# Patient Record
Sex: Female | Born: 1995 | Hispanic: Yes | Marital: Married | Smoking: Never smoker
Health system: Southern US, Community
[De-identification: ages and names within clinical notes are randomized; demographics above are authoritative.]

## PROBLEM LIST (undated history)

## (undated) DIAGNOSIS — Z789 Other specified health status: Secondary | ICD-10-CM

## (undated) DIAGNOSIS — I959 Hypotension, unspecified: Secondary | ICD-10-CM

## (undated) DIAGNOSIS — K529 Noninfective gastroenteritis and colitis, unspecified: Secondary | ICD-10-CM

## (undated) HISTORY — PX: NO PAST SURGERIES: SHX2092

---
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
## 2016-01-10 ENCOUNTER — Ambulatory Visit (INDEPENDENT_AMBULATORY_CARE_PROVIDER_SITE_OTHER): Payer: Medicaid Other | Admitting: Women's Health

## 2016-01-10 ENCOUNTER — Encounter: Payer: Self-pay | Admitting: Women's Health

## 2016-01-10 VITALS — BP 97/48 | HR 93 | Wt 115.0 lb

## 2016-01-10 DIAGNOSIS — Z331 Pregnant state, incidental: Secondary | ICD-10-CM

## 2016-01-10 DIAGNOSIS — Z3482 Encounter for supervision of other normal pregnancy, second trimester: Secondary | ICD-10-CM | POA: Diagnosis not present

## 2016-01-10 DIAGNOSIS — Z1389 Encounter for screening for other disorder: Secondary | ICD-10-CM

## 2016-01-10 DIAGNOSIS — Z3A19 19 weeks gestation of pregnancy: Secondary | ICD-10-CM

## 2016-01-10 DIAGNOSIS — Z363 Encounter for antenatal screening for malformations: Secondary | ICD-10-CM

## 2016-01-10 DIAGNOSIS — Z3682 Encounter for antenatal screening for nuchal translucency: Secondary | ICD-10-CM

## 2016-01-10 LAB — POCT URINALYSIS DIPSTICK
Blood, UA: NEGATIVE
Glucose, UA: NEGATIVE
Ketones, UA: NEGATIVE
LEUKOCYTES UA: NEGATIVE
Nitrite, UA: NEGATIVE
PROTEIN UA: NEGATIVE

## 2016-01-10 MED ORDER — PRENATAL PLUS 27-1 MG PO TABS: 1.0000 | ORAL_TABLET | Freq: Every day | ORAL | 12 refills | Status: DC

## 2016-01-10 NOTE — Progress Notes (Signed)
Low-risk OB appointment G2P1001 7210w4d Estimated Date of Delivery: 06/08/16 BP (!) 97/48   Pulse 93   Wt 115 lb (52.2 kg)   LMP 09/02/2015 (Exact Date)   BP, weight, and urine reviewed.  Refer to obstetrical flow sheet for FH & FHR.  Reports good fm.  Denies regular uc's, lof, vb, or uti s/s. No complaints. Has applied for presumptive Mcaid.  Reviewed warning s/s to report, fm. Plan:  Continue routine obstetrical care  F/U in 2wks for OB appointment and anatomy u/s 2nd IT today

## 2016-01-10 NOTE — Patient Instructions (Signed)

## 2016-01-10 NOTE — Addendum Note (Signed)
Addended by: Shawna ClampBOOKER, Blanche Gallien R on: 01/10/2016 12:21 PM   Modules accepted: Orders

## 2016-01-13 NOTE — L&D Delivery Note (Signed)
21 y.o. G2P1001 at 1198w0d delivered a viable female infant in cephalic, LOA position. Loose nuchal cord, easily reduced. Anterior shoulder delivered with ease. 60 sec delayed cord clamping. Cord clamped x2 and cut. Placenta delivered spontaneously intact, with 3VC. Fundus firm on exam with massage and pitocin. Good hemostasis noted.  Anesthesia: No epidural Laceration: small periurethral, no sutures needed Suture: None Good hemostasis noted. EBL: 200 cc  Mom and baby recovering in LDR.    Weight: Pending skin to skin  Sponge and instrument count were correct x2. Placenta sent to L&D.  Howard PouchLauren Feng, MD PGY-1 Family Medicine 06/01/2016, 2:42 PM   OB FELLOW DELIVERY ATTESTATION  I was gloved and present for the delivery in its entirety, and I agree with the above resident's note.    Ernestina PennaNicholas Cystal Shannahan, MD 3:09 PM

## 2016-01-15 LAB — MATERNAL SCREEN, INTEGRATED #2
AFP MARKER: 74.1 ng/mL
AFP MoM: 1.36
CROWN RUMP LENGTH: 46.8 mm
DIA MOM: 0.81
DIA Value: 184.4 pg/mL
Estriol, Unconjugated: 2.22 ng/mL
GESTATIONAL AGE: 18.9 wk
Gest. Age on Collection Date: 11.4 weeks
HCG MOM: 1.4
Maternal Age at EDD: 19.8 years
NUCHAL TRANSLUCENCY MOM: 0.99
Nuchal Translucency (NT): 1.3 mm
Number of Fetuses: 1
PAPP-A MOM: 0.54
PAPP-A Value: 480.8 ng/mL
Test Results:: NEGATIVE
WEIGHT: 109 [lb_av]
Weight: 109 [lb_av]
hCG Value: 37.3 IU/mL
uE3 MoM: 1.38

## 2016-01-24 ENCOUNTER — Ambulatory Visit (INDEPENDENT_AMBULATORY_CARE_PROVIDER_SITE_OTHER): Payer: Self-pay

## 2016-01-24 ENCOUNTER — Ambulatory Visit (INDEPENDENT_AMBULATORY_CARE_PROVIDER_SITE_OTHER): Payer: Self-pay | Admitting: Obstetrics & Gynecology

## 2016-01-24 ENCOUNTER — Encounter: Payer: Self-pay | Admitting: Obstetrics & Gynecology

## 2016-01-24 VITALS — BP 90/50 | HR 93 | Wt 117.0 lb

## 2016-01-24 DIAGNOSIS — Z3482 Encounter for supervision of other normal pregnancy, second trimester: Secondary | ICD-10-CM

## 2016-01-24 DIAGNOSIS — Z331 Pregnant state, incidental: Secondary | ICD-10-CM

## 2016-01-24 DIAGNOSIS — Z3A21 21 weeks gestation of pregnancy: Secondary | ICD-10-CM

## 2016-01-24 DIAGNOSIS — Z1389 Encounter for screening for other disorder: Secondary | ICD-10-CM

## 2016-01-24 DIAGNOSIS — Z363 Encounter for antenatal screening for malformations: Secondary | ICD-10-CM

## 2016-01-24 DIAGNOSIS — O321XX1 Maternal care for breech presentation, fetus 1: Secondary | ICD-10-CM

## 2016-01-24 LAB — POCT URINALYSIS DIPSTICK
Blood, UA: NEGATIVE
GLUCOSE UA: NEGATIVE
Ketones, UA: NEGATIVE
Leukocytes, UA: NEGATIVE
NITRITE UA: NEGATIVE
Protein, UA: NEGATIVE

## 2016-01-24 NOTE — Progress Notes (Signed)
G2P1001 341w4d Estimated Date of Delivery: 06/08/16  Blood pressure (!) 90/50, pulse 93, weight 117 lb (53.1 kg), last menstrual period 09/02/2015, unknown if currently breastfeeding.   BP weight and urine results all reviewed and noted.  Please refer to the obstetrical flow sheet for the fundal height and fetal heart rate documentation:  Patient reports good fetal movement, denies any bleeding and no rupture of membranes symptoms or regular contractions. Patient is without complaints. All questions were answered.  Orders Placed This Encounter  Procedures  . POCT urinalysis dipstick    Plan:  Continued routine obstetrical care, sonogram is normal  Return in about 4 weeks (around 02/21/2016) for LROB.

## 2016-01-24 NOTE — Progress Notes (Signed)
US 20+4 wks,breech,cx 3.1 cm,anterior pl gr 0,normal left ovary,right ovary not visualized,fhr 141 bpm,svp of fluid 5 cm,efw 329 g,anatomy complete,no obvious abnormalities seen

## 2016-02-20 ENCOUNTER — Encounter: Payer: Self-pay | Admitting: Women's Health

## 2016-02-27 ENCOUNTER — Encounter: Payer: Self-pay | Admitting: Women's Health

## 2016-02-27 ENCOUNTER — Ambulatory Visit (INDEPENDENT_AMBULATORY_CARE_PROVIDER_SITE_OTHER): Payer: Self-pay | Admitting: Women's Health

## 2016-02-27 VITALS — BP 104/60 | HR 80 | Wt 127.0 lb

## 2016-02-27 DIAGNOSIS — N898 Other specified noninflammatory disorders of vagina: Secondary | ICD-10-CM

## 2016-02-27 DIAGNOSIS — Z3482 Encounter for supervision of other normal pregnancy, second trimester: Secondary | ICD-10-CM

## 2016-02-27 DIAGNOSIS — O26892 Other specified pregnancy related conditions, second trimester: Secondary | ICD-10-CM

## 2016-02-27 DIAGNOSIS — Z1389 Encounter for screening for other disorder: Secondary | ICD-10-CM

## 2016-02-27 DIAGNOSIS — Z3A25 25 weeks gestation of pregnancy: Secondary | ICD-10-CM

## 2016-02-27 DIAGNOSIS — Z331 Pregnant state, incidental: Secondary | ICD-10-CM

## 2016-02-27 LAB — POCT URINALYSIS DIPSTICK
Blood, UA: NEGATIVE
Glucose, UA: NEGATIVE
Ketones, UA: NEGATIVE
NITRITE UA: NEGATIVE
PROTEIN UA: NEGATIVE

## 2016-02-27 LAB — POCT WET PREP (WET MOUNT)
Clue Cells Wet Prep Whiff POC: NEGATIVE
Trichomonas Wet Prep HPF POC: ABSENT

## 2016-02-27 NOTE — Patient Instructions (Addendum)
Monistat 7 for yeast infection  You will have your sugar test next visit.  Please do not eat or drink anything after midnight the night before you come, not even water.  You will be here for at least two hours.     Call the office [phone]) or go to [facility] if:  You begin to have strong, frequent contractions  Your water breaks.  Sometimes it is a big gush of fluid, sometimes it is just a trickle that keeps getting your panties wet or running down your legs  You have vaginal bleeding.  It is normal to have a small amount of spotting if your cervix was checked.   You don't feel your baby moving like normal.  If you don't, get you something to eat and drink and lay down and focus on feeling your baby move.   If your baby is still not moving like normal, you should call the office or go to [facility].  [place]intonville[place]undo trimestre de [place]barazo (Second Trimester of Pregnancy) El segundo trimestre va desde la semana13 hasta la 28, desde el cuarto hasta el sexto mes, y suele ser el momento en el que mejor se siente. Su organismo se ha adaptado a Charity fundraiserestar embarazada y comienza a Diplomatic Services operational officersentirse fsicamente mejor. En general, las nuseas matutinas han disminuido o han desaparecido completamente, puede tener ms energa y un aumento de apetito. El segundo trimestre es tambin la poca en la que el feto se desarrolla rpidamente. Hacia el final del sexto mes, el feto mide aproximadamente 9pulgadas (23cm) y pesa alrededor de 1 libras (700g). Es probable que sienta que el beb se Teacher, English as a foreign languagemueve (da pataditas) entre las 18 y 20semanas del Psychiatristembarazo. CAMBIOS EN EL ORGANISMO Su organismo atraviesa por muchos cambios durante el [place] Acresembarazo, y estos varan de Neomia Dearuna mujer a Educational psychologistotra.  Seguir American Standard Companiesaumentando de peso. Notar que la parte baja del abdomen sobresale.  Podrn aparecer las primeras Albertson'sestras en las caderas, el abdomen y las Ceciliamamas.  Es posible que tenga dolores de cabeza que pueden aliviarse con los medicamentos que el  mdico le permita tomar.  Tal vez tenga necesidad de orinar con ms frecuencia porque el feto est ejerciendo presin Ambulance personsobre la vejiga.  Debido al Vanetta Muldersembarazo podr sentir Anthoney Haradaacidez estomacal con frecuencia.  Puede estar estreida, ya que ciertas hormonas enlentecen los movimientos de los msculos que New York Life Insuranceempujan los desechos a travs de los intestinos.  Pueden aparecer hemorroides o abultarse e hincharse las venas (venas varicosas).  Puede tener dolor de espalda que se debe al Citigroupaumento de peso y a que las hormonas del Management consultantembarazo relajan las articulaciones entre los huesos de la pelvis, y Public librariancomo consecuencia de la modificacin del peso y los msculos que mantienen el equilibrio.  Las ConAgra Foodsmamas seguirn creciendo y Development worker, communityle dolern.  Las Veterinary surgeonencas pueden sangrar y estar sensibles al cepillado y al hilo dental.  Pueden aparecer zonas oscuras o manchas (cloasma, mscara del Psychiatristembarazo) en el rostro que probablemente se atenuar despus del nacimiento del beb.  Es posible que se forme una lnea oscura desde el ombligo hasta la zona del pubis (linea nigra) que probablemente se atenuar despus del nacimiento del beb.  Tal vez haya cambios en el cabello que pueden incluir su engrosamiento, crecimiento rpido y cambios en la textura. Adems, a algunas mujeres se les cae el cabello durante o despus del embarazo, o tienen el cabello seco o fino. Lo ms probable es que el cabello se le normalice despus del nacimiento del beb. QU DEBE ESPERAR EN  LAS CONSULTAS PRENATALES Durante una visita prenatal de rutina:  La pesarn para asegurarse de que usted y el feto estn creciendo normalmente.  Le tomarn la presin arterial.  Le medirn el abdomen para controlar el desarrollo del beb.  Se escucharn los latidos cardacos fetales.  Se evaluarn los resultados de los estudios solicitados en visitas anteriores. El mdico puede preguntarle lo siguiente:  Cmo se siente.  Si siente los movimientos del beb.  Si ha tenido  sntomas anormales, como prdida de lquido, [employer], dolores de cabeza intensos o clicos abdominales.  Si est consumiendo algn producto que contenga tabaco, como cigarrillos, tabaco de Theatre manager y Administrator, Civil Service.  Si tiene [employer]. Otros estudios que podrn realizarse durante el segundo trimestre incluyen lo siguiente:  Anlisis de sangre para detectar lo siguiente:  Concentraciones de hierro bajas (anemia).  Diabetes gestacional (entre la semana 24 y la 28).  Anticuerpos Rh.  Anlisis de orina para detectar infecciones, diabetes o protenas en la orina.  Una ecografa para confirmar que el beb crece y se desarrolla correctamente.  Una amniocentesis para diagnosticar posibles problemas genticos.  Estudios del feto para descartar espina bfida y sndrome de Down.  Prueba del VIH (virus de inmunodeficiencia humana). Los exmenes prenatales de rutina incluyen la prueba de deteccin del VIH, a menos que decida no Futures trader. INSTRUCCIONES PARA EL CUIDADO EN EL HOGAR  Evite fumar, consumir hierbas, beber alcohol y tomar frmacos que no le hayan recetado. Estas sustancias qumicas afectan la formacin y el desarrollo del beb.  No consuma ningn producto que contenga tabaco, lo que incluye cigarrillos, tabaco de Theatre manager y Administrator, Civil Service. Si necesita ayuda para dejar de fumar, consulte al American Express. Puede recibir asesoramiento y otro tipo de recursos para dejar de fumar.  Siga las indicaciones del mdico en relacin con el uso de medicamentos. Durante el embarazo, hay medicamentos que son seguros de tomar y otros que no.  Haga ejercicio solamente como se lo haya indicado el mdico. Sentir clicos uterinos es un buen signo para Restaurant manager, fast food actividad fsica.  Contine comiendo alimentos sanos con regularidad.  Use un sostn que le brinde buen soporte si le [employer].  No se d baos de inmersin en agua caliente, baos turcos ni saunas.  Use el  cinturn de seguridad en todo momento mientras conduce.  No coma carne cruda ni queso sin cocinar; evite el contacto con las bandejas sanitarias de los gatos y la tierra que estos animales usan. Estos elementos contienen grmenes que pueden causar defectos congnitos en el beb.  Tome las vitaminas prenatales.  Tome entre 1500 y 2000mg  de calcio diariamente comenzando en la semana20 del embarazo [place].  Si est estreida, pruebe un laxante suave (si el mdico lo autoriza). Consuma ms alimentos ricos en fibra, como vegetales y frutas frescos y Radiation protection practitioner. Beba gran cantidad de lquido para mantener la orina de tono claro o color amarillo plido.  Dese baos de asiento con agua tibia para Engineer, materials o las molestias causadas por las hemorroides. Use una crema para las hemorroides si el mdico la autoriza.  Si tiene venas varicosas, use medias de descanso. Eleve los pies durante , 3 o 4veces por da. Limite el consumo de sal en su dieta.  No levante objetos pesados, use zapatos de tacones bajos y [address].  Descanse con las piernas elevadas si tiene calambres o dolor de cintura.  Visite a su dentista si an no lo ha [employer]  embarazo. Use un cepillo de dientes blando para higienizarse los dientes y psese el hilo dental con suavidad.  Puede seguir [employer], a menos que el mdico le indique lo contrario.  Concurra a todas las visitas prenatales segn las indicaciones de su mdico. SOLICITE ATENCIN MDICA SI:  [place].  Siente clicos leves, presin en la pelvis o dolor persistente en el abdomen.  Tiene nuseas, vmitos o diarrea persistentes.  Brett Fairy secrecin vaginal con mal olor.  Siente dolor al [employer]ocoPhillips. SOLICITE ATENCIN MDICA DE INMEDIATO SI:  Tiene fiebre.  Tiene una prdida de lquido por la vagina.  Tiene sangrado o pequeas prdidas vaginales.  Siente dolor intenso o clicos  en el abdomen.  Sube o baja de peso rpidamente.  Tiene dificultad para respirar y siente dolor de pecho.  Sbitamente se le hinchan mucho el rostro, las [place], los tobillos, los pies o las piernas.  No ha sentido los movimientos del beb durante Georgianne Fick.  Siente un dolor de cabeza intenso que no se alivia con medicamentos.  Su visin se modifica. Esta informacin no tiene Theme park manager el consejo del mdico. Asegrese de hacerle al mdico cualquier pregunta que tenga. Document Released: 10/08/2004 Document Revised: 01/19/2014 Document Reviewed: 03/01/2012 Elsevier Interactive Patient Education  2017 [employer].

## 2016-02-27 NOTE — Progress Notes (Signed)
Low-risk OB appointment G2P1001 2638w3d Estimated Date of Delivery: 06/08/16 BP 104/60   Pulse 80   Wt 127 lb (57.6 kg)   LMP 09/02/2015 (Exact Date)   BP, weight, and urine reviewed.  Refer to obstetrical flow sheet for FH & FHR.  Reports good fm.  Denies regular uc's, lof, vb, or uti s/s. White malodorous d/c x 2wks, no itching/irritation.  Spec exam: cx visually closed, mod amt thick white nondorous d/c, wet prep mod yeast, to use otc monistat 7, no sex while taking Reviewed ptl s/s, fm. Plan:  Continue routine obstetrical care  F/U in 3wks for OB appointment and pn2 Recommended flu shot w/ pcp/hd (<21yo)

## 2016-03-19 ENCOUNTER — Encounter: Payer: Self-pay | Admitting: Women's Health

## 2016-03-19 ENCOUNTER — Other Ambulatory Visit: Payer: Self-pay

## 2016-03-24 ENCOUNTER — Other Ambulatory Visit: Payer: Self-pay

## 2016-03-24 ENCOUNTER — Encounter: Payer: Self-pay | Admitting: Advanced Practice Midwife

## 2016-03-31 ENCOUNTER — Encounter: Payer: Self-pay | Admitting: Women's Health

## 2016-03-31 ENCOUNTER — Other Ambulatory Visit: Payer: Self-pay

## 2016-03-31 ENCOUNTER — Encounter (INDEPENDENT_AMBULATORY_CARE_PROVIDER_SITE_OTHER): Payer: Self-pay

## 2016-03-31 ENCOUNTER — Ambulatory Visit (INDEPENDENT_AMBULATORY_CARE_PROVIDER_SITE_OTHER): Payer: Self-pay | Admitting: Women's Health

## 2016-03-31 VITALS — BP 110/70 | HR 84 | Wt 134.0 lb

## 2016-03-31 DIAGNOSIS — Z331 Pregnant state, incidental: Secondary | ICD-10-CM

## 2016-03-31 DIAGNOSIS — Z131 Encounter for screening for diabetes mellitus: Secondary | ICD-10-CM

## 2016-03-31 DIAGNOSIS — Z1389 Encounter for screening for other disorder: Secondary | ICD-10-CM

## 2016-03-31 DIAGNOSIS — O26843 Uterine size-date discrepancy, third trimester: Secondary | ICD-10-CM

## 2016-03-31 DIAGNOSIS — Z3A30 30 weeks gestation of pregnancy: Secondary | ICD-10-CM

## 2016-03-31 DIAGNOSIS — Z3483 Encounter for supervision of other normal pregnancy, third trimester: Secondary | ICD-10-CM

## 2016-03-31 LAB — POCT URINALYSIS DIPSTICK
Blood, UA: NEGATIVE
Glucose, UA: NEGATIVE
KETONES UA: NEGATIVE
Leukocytes, UA: NEGATIVE
Nitrite, UA: NEGATIVE
PROTEIN UA: NEGATIVE

## 2016-03-31 NOTE — Patient Instructions (Signed)
Call the office ([phone]) or go to [facility] if:  You begin to have strong, frequent contractions  Your water breaks.  Sometimes it is a big gush of fluid, sometimes it is just a trickle that keeps getting your panties wet or running down your legs  You have vaginal bleeding.  It is normal to have a small amount of spotting if your cervix was checked.   You don't feel your baby moving like normal.  If you don't, get you something to eat and drink and lay down and focus on feeling your baby move.  You should feel at least 10 movements in 2 hours.  If you don't, you should call the office or go to [facility].     Tdap Vaccine  It is recommended that you get the Tdap vaccine during the third trimester of EACH pregnancy to help protect your baby from getting pertussis (whooping cough)  27-36 weeks is the BEST time to do this so that you can pass the protection on to your baby. During pregnancy is better than after pregnancy, but if you are unable to get it during pregnancy it will be offered at the hospital.   You can get this vaccine at the health department or your family doctor  Everyone who will be around your baby should also be up-to-date on their vaccines. Adults (who are not pregnant) only need 1 dose of Tdap during adulthood.     Tercer trimestre de embarazo (Third Trimester of Pregnancy) El tercer trimestre comprende desde la semana29 hasta la semana42, es decir, desde el mes7 hasta el mes9. El tercer trimestre es un perodo en el que el feto crece rpidamente. Hacia el final del noveno mes, el feto mide alrededor de 20pulgadas (45cm) de largo y pesa entre 6 y 10 libras (2,700 y 4,500kg). CAMBIOS EN EL ORGANISMO Su organismo atraviesa por muchos cambios durante el embarazo, y estos varan de una mujer a otra.  Seguir aumentando de peso. Es de esperar que aumente entre 25 y 35libras (11 y 16kg) hacia el final del embarazo.  Podrn aparecer las primeras  estras en las caderas, el abdomen y las mamas.  Puede tener necesidad de orinar con ms frecuencia porque el feto baja hacia la pelvis y ejerce presin sobre la vejiga.  Debido al embarazo podr sentir acidez estomacal con frecuencia.  Puede estar estreida, ya que ciertas hormonas enlentecen los movimientos de los msculos que empujan los desechos a travs de los intestinos.  Pueden aparecer hemorroides o abultarse e hincharse las venas (venas varicosas).  Puede sentir dolor plvico debido al aumento de peso y a que las hormonas del embarazo relajan las articulaciones entre los huesos de la pelvis. El dolor de espalda puede ser consecuencia de la sobrecarga de los msculos que soportan la postura.  Tal vez haya cambios en el cabello que pueden incluir su engrosamiento, crecimiento rpido y cambios en la textura. Adems, a algunas mujeres se les cae el cabello durante o despus del embarazo, o tienen el cabello seco o fino. Lo ms probable es que el cabello se le normalice despus del nacimiento del beb.  Las mamas seguirn creciendo y le dolern. A veces, puede haber una secrecin amarilla de las mamas llamada calostro.  El ombligo puede salir hacia afuera.  Puede sentir que le falta el aire debido a que se expande el tero.  Puede notar que el feto "baja" o lo siente ms bajo, en el abdomen.  Puede tener una prdida   de secrecin mucosa con sangre. Esto suele ocurrir en el trmino de unos pocos das a una semana antes de que comience el trabajo de parto.  El cuello del tero se vuelve delgado y blando (se borra) cerca de la fecha de parto. QU DEBE ESPERAR EN LOS EXMENES PRENATALES Le harn exmenes prenatales cada 2semanas hasta la semana36. A partir de ese momento le harn exmenes semanales. Durante una visita prenatal de rutina:  La pesarn para asegurarse de que usted y el feto estn creciendo normalmente.  Le tomarn la presin arterial.  Le medirn el abdomen para  controlar el desarrollo del beb.  Se escucharn los latidos cardacos fetales.  Se evaluarn los resultados de los estudios solicitados en visitas anteriores.  Le revisarn el cuello del tero cuando est prxima la fecha de parto para controlar si este se ha borrado. Alrededor de la semana36, el mdico le revisar el cuello del tero. Al mismo tiempo, realizar un anlisis de las secreciones del tejido vaginal. Este examen es para determinar si hay un tipo de bacteria, estreptococo Grupo B. El mdico le explicar esto con ms detalle. El mdico puede preguntarle lo siguiente:  Cmo le gustara que fuera el parto.  Cmo se siente.  Si siente los movimientos del beb.  Si ha tenido sntomas anormales, como prdida de lquido, sangrado, dolores de cabeza intensos o clicos abdominales.  Si est consumiendo algn producto que contenga tabaco, como cigarrillos, tabaco de mascar y cigarrillos electrnicos.  Si tiene alguna pregunta. Otros exmenes o estudios de deteccin que pueden realizarse durante el tercer trimestre incluyen lo siguiente:  Anlisis de sangre para controlar los niveles de hierro (anemia).  Controles fetales para determinar su salud, nivel de actividad y crecimiento. Si tiene alguna enfermedad o hay problemas durante el embarazo, le harn estudios.  Prueba del VIH (virus de inmunodeficiencia humana). Si corre un riesgo alto, pueden realizarle una prueba de deteccin del VIH durante el tercer trimestre del embarazo. FALSO TRABAJO DE PARTO Es posible que sienta contracciones leves e irregulares que finalmente desaparecen. Se llaman contracciones de Braxton Hicks o falso trabajo de parto. Las contracciones pueden durar horas, das o incluso semanas, antes de que el verdadero trabajo de parto se inicie. Si las contracciones ocurren a intervalos regulares, se intensifican o se hacen dolorosas, lo mejor es que la revise el mdico. SIGNOS DE TRABAJO DE PARTO  Clicos de tipo  menstrual.  Contracciones cada 5minutos o menos.  Contracciones que comienzan en la parte superior del tero y se extienden hacia abajo, a la zona inferior del abdomen y la espalda.  Sensacin de mayor presin en la pelvis o dolor de espalda.  Una secrecin de mucosidad acuosa o con sangre que sale de la vagina. Si tiene alguno de estos signos antes de la semana37 del embarazo, llame a su mdico de inmediato. Debe concurrir al hospital para que la controlen inmediatamente. INSTRUCCIONES PARA EL CUIDADO EN EL HOGAR  Evite fumar, consumir hierbas, beber alcohol y tomar frmacos que no le hayan recetado. Estas sustancias qumicas afectan la formacin y el desarrollo del beb.  No consuma ningn producto que contenga tabaco, lo que incluye cigarrillos, tabaco de mascar y cigarrillos electrnicos. Si necesita ayuda para dejar de fumar, consulte al mdico. Puede recibir asesoramiento y otro tipo de recursos para dejar de fumar.  Siga las indicaciones del mdico en relacin con el uso de medicamentos. Durante el embarazo, hay medicamentos que son seguros de tomar y otros que no.  Haga ejercicio   solamente como se lo haya indicado el mdico. Sentir clicos uterinos es un buen signo para detener la actividad fsica.  Contine comiendo alimentos sanos con regularidad.  Use un sostn que le brinde buen soporte si le duelen las mamas.  No se d baos de inmersin en agua caliente, baos turcos ni saunas.  Use el cinturn de seguridad en todo momento mientras conduce.  No coma carne cruda ni queso sin cocinar; evite el contacto con las bandejas sanitarias de los gatos y la tierra que estos animales usan. Estos elementos contienen grmenes que pueden causar defectos congnitos en el beb.  Tome las vitaminas prenatales.  Tome entre 1500 y 2000mg de calcio diariamente comenzando en la semana20 del embarazo hasta el parto.  Si est estreida, pruebe un laxante suave (si el mdico lo autoriza).  Consuma ms alimentos ricos en fibra, como vegetales y frutas frescos y cereales integrales. Beba gran cantidad de lquido para mantener la orina de tono claro o color amarillo plido.  Dese baos de asiento con agua tibia para aliviar el dolor o las molestias causadas por las hemorroides. Use una crema para las hemorroides si el mdico la autoriza.  Si tiene venas varicosas, use medias de descanso. Eleve los pies durante 15minutos, 3 o 4veces por da. Limite el consumo de sal en su dieta.  Evite levantar objetos pesados, use zapatos de tacones bajos y mantenga una buena postura.  Descanse con las piernas elevadas si tiene calambres o dolor de cintura.  Visite a su dentista si no lo ha hecho durante el embarazo. Use un cepillo de dientes blando para higienizarse los dientes y psese el hilo dental con suavidad.  Puede seguir manteniendo relaciones sexuales, a menos que el mdico le indique lo contrario.  No haga viajes largos excepto que sea absolutamente necesario y solo con la autorizacin del mdico.  Tome clases prenatales para entender, practicar y hacer preguntas sobre el trabajo de parto y el parto.  Haga un ensayo de la partida al hospital.  Prepare el bolso que llevar al hospital.  Prepare la habitacin del beb.  Concurra a todas las visitas prenatales segn las indicaciones de su mdico. SOLICITE ATENCIN MDICA SI:  No est segura de que est en trabajo de parto o de que ha roto la bolsa de las aguas.  Tiene mareos.  Siente clicos leves, presin en la pelvis o dolor persistente en el abdomen.  Tiene nuseas, vmitos o diarrea persistentes.  Observa una secrecin vaginal con mal olor.  Siente dolor al orinar. SOLICITE ATENCIN MDICA DE INMEDIATO SI:  Tiene fiebre.  Tiene una prdida de lquido por la vagina.  Tiene sangrado o pequeas prdidas vaginales.  Siente dolor intenso o clicos en el abdomen.  Sube o baja de peso rpidamente.  Tiene dificultad  para respirar y siente dolor de pecho.  Sbitamente se le hinchan mucho el rostro, las manos, los tobillos, los pies o las piernas.  No ha sentido los movimientos del beb durante una hora.  Siente un dolor de cabeza intenso que no se alivia con medicamentos.  Su visin se modifica. Esta informacin no tiene como fin reemplazar el consejo del mdico. Asegrese de hacerle al mdico cualquier pregunta que tenga. Document Released: 10/08/2004 Document Revised: 01/19/2014 Document Reviewed: 03/01/2012 Elsevier Interactive Patient Education  2017 Elsevier Inc.  

## 2016-03-31 NOTE — Progress Notes (Signed)
Low-risk OB appointment G2P1001 5965w1d Estimated Date of Delivery: 06/08/16 BP 110/70   Pulse 84   Wt 134 lb (60.8 kg)   LMP 09/02/2015 (Exact Date)   BP, weight, and urine reviewed.  Refer to obstetrical flow sheet for FH & FHR.  Reports good fm.  Denies regular uc's, lof, vb, or uti s/s. No complaints. Reviewed ptl s/s, fkc. Recommended Tdap at HD/PCP per CDC guidelines.  Plan:  Continue routine obstetrical care  F/U in asap for efw/afi u/s for s<d (no visit), then 2wks for OB appointment  PN2 today

## 2016-04-01 ENCOUNTER — Telehealth: Payer: Self-pay | Admitting: Women's Health

## 2016-04-01 ENCOUNTER — Encounter (INDEPENDENT_AMBULATORY_CARE_PROVIDER_SITE_OTHER): Payer: Self-pay

## 2016-04-01 ENCOUNTER — Ambulatory Visit (INDEPENDENT_AMBULATORY_CARE_PROVIDER_SITE_OTHER): Payer: Self-pay

## 2016-04-01 ENCOUNTER — Other Ambulatory Visit: Payer: Self-pay | Admitting: Women's Health

## 2016-04-01 DIAGNOSIS — O26843 Uterine size-date discrepancy, third trimester: Secondary | ICD-10-CM

## 2016-04-01 LAB — CBC
HEMOGLOBIN: 10.8 g/dL — AB (ref 11.1–15.9)
Hematocrit: 32.4 % — ABNORMAL LOW (ref 34.0–46.6)
MCH: 30.1 pg (ref 26.6–33.0)
MCHC: 33.3 g/dL (ref 31.5–35.7)
MCV: 90 fL (ref 79–97)
Platelets: 223 10*3/uL (ref 150–379)
RBC: 3.59 x10E6/uL — AB (ref 3.77–5.28)
RDW: 13.3 % (ref 12.3–15.4)
WBC: 7.7 10*3/uL (ref 3.4–10.8)

## 2016-04-01 LAB — GLUCOSE TOLERANCE, 2 HOURS W/ 1HR
GLUCOSE, FASTING: 74 mg/dL (ref 65–91)
Glucose, 1 hour: 143 mg/dL (ref 65–179)
Glucose, 2 hour: 115 mg/dL (ref 65–152)

## 2016-04-01 LAB — HIV ANTIBODY (ROUTINE TESTING W REFLEX): HIV SCREEN 4TH GENERATION: NONREACTIVE

## 2016-04-01 LAB — RPR: RPR: NONREACTIVE

## 2016-04-01 LAB — ANTIBODY SCREEN: Antibody Screen: NEGATIVE

## 2016-04-01 MED ORDER — FERROUS SULFATE 325 (65 FE) MG PO TABS: 325.0000 mg | ORAL_TABLET | Freq: Two times a day (BID) | ORAL | 3 refills | Status: DC

## 2016-04-01 NOTE — Telephone Encounter (Signed)
I have called the patient and have left a message for her to give me a call to let her know what the provider has sent me.

## 2016-04-01 NOTE — Progress Notes (Signed)
US 30+2 wks,cephalic,ant pl gr 2,fhr 150 bpm,afi 13 cm,efw 1573 g,52%,normal ovaries bilat

## 2016-04-03 ENCOUNTER — Other Ambulatory Visit: Payer: Self-pay

## 2016-04-06 ENCOUNTER — Telehealth: Payer: Self-pay | Admitting: Women's Health

## 2016-04-06 NOTE — Telephone Encounter (Signed)
Pt has called back and I have given the information in spanish regarding her anemia, Let patient know that her medication is at the pharmacy and what she could be eating to help as well.

## 2016-04-14 ENCOUNTER — Encounter (INDEPENDENT_AMBULATORY_CARE_PROVIDER_SITE_OTHER): Payer: Self-pay

## 2016-04-14 ENCOUNTER — Ambulatory Visit (INDEPENDENT_AMBULATORY_CARE_PROVIDER_SITE_OTHER): Payer: Self-pay | Admitting: Women's Health

## 2016-04-14 ENCOUNTER — Encounter: Payer: Self-pay | Admitting: Women's Health

## 2016-04-14 VITALS — BP 102/56 | HR 84 | Wt 138.0 lb

## 2016-04-14 DIAGNOSIS — Z331 Pregnant state, incidental: Secondary | ICD-10-CM

## 2016-04-14 DIAGNOSIS — Z3483 Encounter for supervision of other normal pregnancy, third trimester: Secondary | ICD-10-CM

## 2016-04-14 DIAGNOSIS — Z1389 Encounter for screening for other disorder: Secondary | ICD-10-CM

## 2016-04-14 LAB — POCT URINALYSIS DIPSTICK
GLUCOSE UA: NEGATIVE
KETONES UA: NEGATIVE
Leukocytes, UA: NEGATIVE
Nitrite, UA: NEGATIVE
Protein, UA: NEGATIVE
RBC UA: NEGATIVE

## 2016-04-14 NOTE — Patient Instructions (Signed)
For your lower back pain you may:  Purchase a pregnancy belt from [employer], [employer], [employer], etc and wear it while you are up and about  Take warm baths  Use a heating pad to your lower back for no longer than 20 minutes at a time, and do not place near abdomen  Take tylenol as needed. Please follow directions on the bottle   Call the office [phone]) or go to [facility] if:  You begin to have strong, frequent contractions  Your water breaks.  Sometimes it is a big gush of fluid, sometimes it is just a trickle that keeps getting your panties wet or running down your legs  You have vaginal bleeding.  It is normal to have a small amount of spotting if your cervix was checked.   You don't feel your baby moving like normal.  If you don't, get you something to eat and drink and lay down and focus on feeling your baby move.  You should feel at least 10 movements in 2 hours.  If you don't, you should call the office or go to [facility].     Informacin sobre parto y [place] de parto prematuros (Preterm Labor and Birth Information) La duracin de un embarazo normal es de 39 a 41semanas. Se llama trabajo de parto prematuro cuando se inicia antes de las 37semanas de [place]. CULES SON LOS FACTORES DE RIESGO DEL TRABAJO DE PARTO PREMATURO? Existen mayores probabilidades de trabajo de parto prematuro en mujeres con las siguientes caractersticas:  Tienen ciertas infecciones durante el embarazo, como infeccin de vejiga, infeccin de transmisin sexual o infeccin en el tero (corioamnionitis).  Tienen el cuello del tero ms corto que lo normal.  Tuvieron trabajo de parto prematuro anteriormente.  Se sometieron a una ciruga en el cuello del tero.  Son menores de 17aos o [address] de 35aos de edad.  Son afroamericanas.  Estn embarazadas de [employer] o de varios bebs (gestacin mltiple).  Consumen drogas o fuman mientras estn embarazadas.  No  aumentan de peso lo suficiente durante el [employer].  Se embarazan poco despus de [employer]. CULES SON LOS SNTOMAS DEL TRABAJO DE PARTO PREMATURO? Los sntomas del trabajo de parto prematuro incluyen lo siguiente:  Educational psychologist similares a los que ocurren durante el perodo menstrual. Los calambres pueden presentarse con diarrea.  Dolor en el abdomen o en la parte inferior de la espalda.  Contracciones uterinas regulares que se pueden sentir como una presin en el abdomen.  Una sensacin de mayor presin en la pelvis.  Aumento de la secrecin de moco acuoso o sanguinolento en la vagina.  Rotura de bolsa (rotura de saco amnitico). POR QU ES IMPORTANTE RECONOCER LOS SIGNOS DEL TRABAJO DE PARTO PREMATURO? Es Public librarian los signos del trabajo de parto prematuro porque los bebs que nacen de forma prematura pueden no estar completamente desarrollados. Por lo tanto, pueden correr mayor riesgo de lo siguiente:  Problemas cardacos y pulmonares a Air cabin crew (crnicos).  Inmediatamente despus del parto, dificultades para regular los sistemas corporales, que incluyen glucemia, temperatura corporal, frecuencia cardaca y frecuencia respiratoria.  Hemorragia cerebral.  Parlisis cerebral.  Dificultades en el aprendizaje.  Muerte. Estos riesgos son [employer] para bebs que nacen antes de las 34semanas de [place]. CMO SE TRATA EL TRABAJO DE PARTO PREMATURO? El tratamiento depende del tiempo de su [place], su afeccin y la salud de su beb. Puede incluir lo siguiente:  Tener un punto (sutura) en el cuello del tero  para evitar que este se abra demasiado pronto (cerclaje).  Tomar medicamentos, por ejemplo:  Medicamentos hormonales. Estos se pueden administrar de forma temprana en el embarazo para ayudar a Visual merchandiser.  Medicamentos para TEFL teacher las contracciones.  Medicamentos que ayudan a [employer] del beb. Estos se pueden  recetar si el riesgo de parto es [place].  Medicamentos para evitar que el beb desarrolle parlisis cerebral. Si el trabajo de parto de inicia antes de las 34semanas de [place], es posible que deba hospitalizarse. QU DEBO HACER SI CREO QUE ESTOY EN TRABAJO DE PARTO PREMATURO? Si cree que est iniciando trabajo de parto prematuro, llame al mdico de inmediato. CMO PUEDO EVITAR EL TRABAJO DE PARTO PREMATURO EN FUTUROS EMBARAZOS? Para aumentar las probabilidades de tener un embarazo a trmino, Financial planner en cuenta lo siguiente:  No consuma ningn producto que contenga tabaco, lo que incluye cigarrillos, tabaco de Theatre manager y Administrator, Civil Service. Si necesita ayuda para dejar de fumar, consulte al mdico.  No consuma drogas ni medicamentos que no sean recetados Academic librarian.  Hable con el mdico antes de tomar suplementos a base de hierbas aunque los [employer].  Asegrese de llegar a un peso Office manager.  Tenga cuidado con las infecciones. Si cree que puede tener una infeccin, consulte al mdico para que la revisen.  Asegrese de informarle al mdico si ha tenido trabajo de parto prematuro antes. Esta informacin no tiene Theme park manager el consejo del mdico. Asegrese de hacerle al mdico cualquier pregunta que tenga. Document Released: 04/07/2007 Document Revised: 08/31/2012 Document Reviewed: 05/22/2015 Elsevier Interactive Patient Education  2017 [employer].

## 2016-04-14 NOTE — Progress Notes (Signed)
Low-risk OB appointment G2P1001 [redacted]w[redacted]d Estimated Date of Delivery: 06/08/16 BP (!) 102/56   Pulse 84   Wt 138 lb (62.6 kg)   LMP 09/02/2015 (Exact Date)   BP, weight, and urine reviewed.  Refer to obstetrical flow sheet for FH & FHR.  Reports good fm.  Denies regular uc's, lof, vb, or uti s/s. Low back pain- reviewed relief measures Reviewed ptl s/s, fkc. Plan:  Continue routine obstetrical care  F/U in 2wks for OB appointment  FH still s<d, normal u/s 2wks ago efw 52% and afi 13cm

## 2016-04-29 ENCOUNTER — Encounter: Payer: Self-pay | Admitting: Obstetrics and Gynecology

## 2016-04-29 ENCOUNTER — Encounter (INDEPENDENT_AMBULATORY_CARE_PROVIDER_SITE_OTHER): Payer: Self-pay

## 2016-04-29 ENCOUNTER — Ambulatory Visit (INDEPENDENT_AMBULATORY_CARE_PROVIDER_SITE_OTHER): Payer: Self-pay | Admitting: Obstetrics and Gynecology

## 2016-04-29 VITALS — BP 120/78 | HR 92 | Wt 143.0 lb

## 2016-04-29 DIAGNOSIS — N898 Other specified noninflammatory disorders of vagina: Secondary | ICD-10-CM

## 2016-04-29 DIAGNOSIS — Z1389 Encounter for screening for other disorder: Secondary | ICD-10-CM

## 2016-04-29 DIAGNOSIS — Z3483 Encounter for supervision of other normal pregnancy, third trimester: Secondary | ICD-10-CM

## 2016-04-29 DIAGNOSIS — Z331 Pregnant state, incidental: Secondary | ICD-10-CM

## 2016-04-29 DIAGNOSIS — O9989 Other specified diseases and conditions complicating pregnancy, childbirth and the puerperium: Secondary | ICD-10-CM

## 2016-04-29 LAB — POCT URINALYSIS DIPSTICK
Blood, UA: NEGATIVE
Color, UA: NEGATIVE
Glucose, UA: NEGATIVE
Ketones, UA: NEGATIVE
LEUKOCYTES UA: NEGATIVE
NITRITE UA: NEGATIVE
PROTEIN UA: NEGATIVE

## 2016-04-29 NOTE — Progress Notes (Signed)
[id number]  Estimated Date of Delivery: 06/08/16 [facility] [redacted]w[redacted]d  Chief Complaint  Patient presents with  . Routine Prenatal Visit    this morning had a little watery d/c twice  ____  Patient complaints: a small amount of watery discharge that occurred twice this morning . Patient reports   good fetal movement,                           denies any bleeding , rupture of membranes,or regular contractions.  Blood pressure 120/78, pulse 92, weight 143 lb (64.9 kg), last menstrual period 09/02/2015, unknown if currently breastfeeding.   Urine results: negative refer to the ob flow sheet for FH and FHR, ,                          Physical Examination: General appearance - alert, well appearing, and in no distress                                      Abdomen - FH 33 ,                                                         -FHR 142                                                         soft, nontender, nondistended, no masses or organomegaly                                      Pelvic - pH is 4.5, cervix is long and closed. Fern negative                                            Questions were answered. Assessment: LROB G2P1001 @ [redacted]w[redacted]d, Estimated Date of Delivery: 06/08/16                          Negative testing for ROM. Plan:  Continued routine obstetrical care,   F/u in 2 weeks for LROB    By signing my name below, I, Sonum Patel, attest that this documentation has been prepared under the direction and in the presence of Tilda Burrow, MD. Electronically Signed: Sonum Patel, Neurosurgeon. 04/29/16. 10:27 AM.  I personally performed the services described in this documentation, which was SCRIBED in my presence. The recorded information has been reviewed and considered accurate. It has been edited as necessary during review. Tilda Burrow, MD

## 2016-05-14 ENCOUNTER — Encounter: Payer: Self-pay | Admitting: Women's Health

## 2016-05-14 ENCOUNTER — Ambulatory Visit (INDEPENDENT_AMBULATORY_CARE_PROVIDER_SITE_OTHER): Payer: Self-pay | Admitting: Women's Health

## 2016-05-14 VITALS — BP 100/60 | HR 92 | Wt 146.5 lb

## 2016-05-14 DIAGNOSIS — Z331 Pregnant state, incidental: Secondary | ICD-10-CM

## 2016-05-14 DIAGNOSIS — Z1389 Encounter for screening for other disorder: Secondary | ICD-10-CM

## 2016-05-14 DIAGNOSIS — Z3483 Encounter for supervision of other normal pregnancy, third trimester: Secondary | ICD-10-CM

## 2016-05-14 LAB — POCT URINALYSIS DIPSTICK
Blood, UA: NEGATIVE
Glucose, UA: NEGATIVE
Ketones, UA: NEGATIVE
NITRITE UA: NEGATIVE
PROTEIN UA: NEGATIVE

## 2016-05-14 NOTE — Patient Instructions (Signed)
Call the office ([phone]) or go to [facility] if:  You begin to have strong, frequent contractions  Your water breaks.  Sometimes it is a big gush of fluid, sometimes it is just a trickle that keeps getting your panties wet or running down your legs  You have vaginal bleeding.  It is normal to have a small amount of spotting if your cervix was checked.   You don't feel your baby moving like normal.  If you don't, get you something to eat and drink and lay down and focus on feeling your baby move.  You should feel at least 10 movements in 2 hours.  If you don't, you should call the office or go to [facility].     Contracciones de Braxton Hicks (Braxton Hicks Contractions) Durante el embarazo, pueden presentarse contracciones uterinas que no siempre indican que est en trabajo de parto. QU SON LAS CONTRACCIONES DE BRAXTON HICKS? Las contracciones que se presentan antes del trabajo de parto se conocen como contracciones de Braxton Hicks o falso trabajo de parto. Hacia el final del embarazo (32 a 34semanas), estas contracciones pueden aparecen con ms frecuencia y volverse ms intensas. No corresponden al trabajo de parto verdadero porque estas contracciones no producen el agrandamiento (la dilatacin) y el afinamiento del cuello del tero. Algunas veces, es difcil distinguirlas del trabajo de parto verdadero porque en algunos casos pueden ser muy intensas, y las personas tienen diferentes niveles de tolerancia al dolor. No debe sentirse avergonzada si concurre al hospital con falso trabajo de parto. En ocasiones, la nica forma de saber si el trabajo de parto es verdadero es que el mdico determine si hay cambios en el cuello del tero. Si no hay problemas prenatales u otras complicaciones de salud asociadas con el embarazo, no habr inconvenientes si la envan a su casa con falso trabajo de parto y espera que comience el verdadero. CMO DIFERENCIAR EL TRABAJO DE PARTO FALSO DEL  VERDADERO Falso trabajo de parto  Las contracciones del falso trabajo de parto duran menos y no son tan intensas como las verdaderas.  Generalmente son irregulares.  A menudo, se sienten en la parte delantera de la parte baja del abdomen y en la ingle,  y pueden desaparecer cuando camina o cambia de posicin mientras est acostada.  Las contracciones se vuelven ms dbiles y su duracin es menor a medida que el tiempo transcurre.  Por lo general, no se hacen progresivamente ms intensas, regulares y cercanas entre s como en el caso del trabajo de parto verdadero. Verdadero trabajo de parto  Las contracciones del verdadero trabajo de parto duran de 30 a 70segundos, son muy regulares y suelen volverse ms intensas, y aumenta su frecuencia.  No desaparecen cuando camina.  La molestia generalmente se siente en la parte superior del tero y se extiende hacia la zona inferior del abdomen y hacia la cintura.  El mdico podr examinarla para determinar si el trabajo de parto es verdadero. El examen mostrar si el cuello del tero se est dilatando y afinando. LO QUE DEBE RECORDAR  Contine haciendo los ejercicios habituales y siga otras indicaciones que el mdico le d.  Tome todos los medicamentos como le indic el mdico.  Concurra a las visitas prenatales regulares.  Coma y beba con moderacin si cree que est en trabajo de parto.  Si las contracciones de Braxton Hicks le provocan incomodidad:  Cambie de posicin: si est acostada o descansando, camine; si est caminando, descanse.  Sintese y descanse   en una baera con agua tibia.  Beba 2 o 3vasos de agua. La deshidratacin puede provocar contracciones.  Respire lenta y profundamente varias veces por hora. CUNDO DEBO BUSCAR ASISTENCIA MDICA INMEDIATA? Solicite atencin mdica de inmediato si:  Las contracciones se intensifican, se hacen ms regulares y cercanas entre s.  Tiene una prdida de lquido por la  vagina.  Tiene fiebre.  Elimina mucosidad manchada con sangre.  Tiene una hemorragia vaginal abundante.  Tiene dolor abdominal permanente.  Tiene un dolor en la zona lumbar que nunca tuvo antes.  Siente que la cabeza del beb empuja hacia abajo y ejerce presin en la zona plvica.  El beb no se mueve tanto como sola. Esta informacin no tiene como fin reemplazar el consejo del mdico. Asegrese de hacerle al mdico cualquier pregunta que tenga. Document Released: 10/08/2004 Document Revised: 04/22/2015 Document Reviewed: 10/10/2012 Elsevier Interactive Patient Education  2017 Elsevier Inc.  

## 2016-05-14 NOTE — Progress Notes (Signed)
Low-risk OB appointment G2P1001 6353w3d Estimated Date of Delivery: 06/08/16 BP 100/60   Pulse 92   Wt 146 lb 8 oz (66.5 kg)   LMP 09/02/2015 (Exact Date)   BP, weight, and urine reviewed.  Refer to obstetrical flow sheet for FH & FHR.  Reports good fm.  Denies regular uc's, lof, vb, or uti s/s. No complaints. Vtx by leopold's Reviewed labor s/s, fkc. Plan:  Continue routine obstetrical care  F/U in 1wk for OB appointment and gbs

## 2016-05-21 ENCOUNTER — Ambulatory Visit (INDEPENDENT_AMBULATORY_CARE_PROVIDER_SITE_OTHER): Payer: Self-pay | Admitting: Women's Health

## 2016-05-21 ENCOUNTER — Encounter: Payer: Self-pay | Admitting: Women's Health

## 2016-05-21 VITALS — BP 112/64 | HR 102 | Wt 145.0 lb

## 2016-05-21 DIAGNOSIS — Z3483 Encounter for supervision of other normal pregnancy, third trimester: Secondary | ICD-10-CM

## 2016-05-21 DIAGNOSIS — O26843 Uterine size-date discrepancy, third trimester: Secondary | ICD-10-CM

## 2016-05-21 DIAGNOSIS — Z3A37 37 weeks gestation of pregnancy: Secondary | ICD-10-CM

## 2016-05-21 DIAGNOSIS — Z1389 Encounter for screening for other disorder: Secondary | ICD-10-CM

## 2016-05-21 DIAGNOSIS — Z331 Pregnant state, incidental: Secondary | ICD-10-CM

## 2016-05-21 LAB — POCT URINALYSIS DIPSTICK
Blood, UA: NEGATIVE
GLUCOSE UA: NEGATIVE
KETONES UA: NEGATIVE
Leukocytes, UA: NEGATIVE
Nitrite, UA: NEGATIVE
PROTEIN UA: NEGATIVE

## 2016-05-21 NOTE — Patient Instructions (Signed)
Call the office ([phone]) or go to [facility] if:  You begin to have strong, frequent contractions  Your water breaks.  Sometimes it is a big gush of fluid, sometimes it is just a trickle that keeps getting your panties wet or running down your legs  You have vaginal bleeding.  It is normal to have a small amount of spotting if your cervix was checked.   You don't feel your baby moving like normal.  If you don't, get you something to eat and drink and lay down and focus on feeling your baby move.  You should feel at least 10 movements in 2 hours.  If you don't, you should call the office or go to [facility].     Contracciones de Braxton Hicks (Braxton Hicks Contractions) Durante el embarazo, pueden presentarse contracciones uterinas que no siempre indican que est en trabajo de parto. QU SON LAS CONTRACCIONES DE BRAXTON HICKS? Las contracciones que se presentan antes del trabajo de parto se conocen como contracciones de Braxton Hicks o falso trabajo de parto. Hacia el final del embarazo (32 a 34semanas), estas contracciones pueden aparecen con ms frecuencia y volverse ms intensas. No corresponden al trabajo de parto verdadero porque estas contracciones no producen el agrandamiento (la dilatacin) y el afinamiento del cuello del tero. Algunas veces, es difcil distinguirlas del trabajo de parto verdadero porque en algunos casos pueden ser muy intensas, y las personas tienen diferentes niveles de tolerancia al dolor. No debe sentirse avergonzada si concurre al hospital con falso trabajo de parto. En ocasiones, la nica forma de saber si el trabajo de parto es verdadero es que el mdico determine si hay cambios en el cuello del tero. Si no hay problemas prenatales u otras complicaciones de salud asociadas con el embarazo, no habr inconvenientes si la envan a su casa con falso trabajo de parto y espera que comience el verdadero. CMO DIFERENCIAR EL TRABAJO DE PARTO FALSO DEL  VERDADERO Falso trabajo de parto  Las contracciones del falso trabajo de parto duran menos y no son tan intensas como las verdaderas.  Generalmente son irregulares.  A menudo, se sienten en la parte delantera de la parte baja del abdomen y en la ingle,  y pueden desaparecer cuando camina o cambia de posicin mientras est acostada.  Las contracciones se vuelven ms dbiles y su duracin es menor a medida que el tiempo transcurre.  Por lo general, no se hacen progresivamente ms intensas, regulares y cercanas entre s como en el caso del trabajo de parto verdadero. Verdadero trabajo de parto  Las contracciones del verdadero trabajo de parto duran de 30 a 70segundos, son muy regulares y suelen volverse ms intensas, y aumenta su frecuencia.  No desaparecen cuando camina.  La molestia generalmente se siente en la parte superior del tero y se extiende hacia la zona inferior del abdomen y hacia la cintura.  El mdico podr examinarla para determinar si el trabajo de parto es verdadero. El examen mostrar si el cuello del tero se est dilatando y afinando. LO QUE DEBE RECORDAR  Contine haciendo los ejercicios habituales y siga otras indicaciones que el mdico le d.  Tome todos los medicamentos como le indic el mdico.  Concurra a las visitas prenatales regulares.  Coma y beba con moderacin si cree que est en trabajo de parto.  Si las contracciones de Braxton Hicks le provocan incomodidad:  Cambie de posicin: si est acostada o descansando, camine; si est caminando, descanse.  Sintese y descanse   en una baera con agua tibia.  Beba 2 o 3vasos de agua. La deshidratacin puede provocar contracciones.  Respire lenta y profundamente varias veces por hora. CUNDO DEBO BUSCAR ASISTENCIA MDICA INMEDIATA? Solicite atencin mdica de inmediato si:  Las contracciones se intensifican, se hacen ms regulares y cercanas entre s.  Tiene una prdida de lquido por la  vagina.  Tiene fiebre.  Elimina mucosidad manchada con sangre.  Tiene una hemorragia vaginal abundante.  Tiene dolor abdominal permanente.  Tiene un dolor en la zona lumbar que nunca tuvo antes.  Siente que la cabeza del beb empuja hacia abajo y ejerce presin en la zona plvica.  El beb no se mueve tanto como sola. Esta informacin no tiene como fin reemplazar el consejo del mdico. Asegrese de hacerle al mdico cualquier pregunta que tenga. Document Released: 10/08/2004 Document Revised: 04/22/2015 Document Reviewed: 10/10/2012 Elsevier Interactive Patient Education  2017 Elsevier Inc.  

## 2016-05-21 NOTE — Progress Notes (Signed)
Low-risk OB appointment G2P1001 863w3d Estimated Date of Delivery: 06/08/16 BP 112/64   Pulse (!) 102   Wt 145 lb (65.8 kg)   LMP 09/02/2015 (Exact Date)   BP, weight, and urine reviewed.  Refer to obstetrical flow sheet for FH & FHR.  Reports good fm.  Denies regular uc's, lof, vb, or uti s/s. No complaints. GBS, gc/ct collected SVE per request: 2/50/-2, vtx Reviewed labor s/s, fkc. Plan:  Continue routine obstetrical care  F/U asap for efw/afi u/s d/t s<d (no visit), then 1wk for OB appointment

## 2016-05-22 ENCOUNTER — Ambulatory Visit (INDEPENDENT_AMBULATORY_CARE_PROVIDER_SITE_OTHER): Payer: Self-pay

## 2016-05-22 DIAGNOSIS — O26843 Uterine size-date discrepancy, third trimester: Secondary | ICD-10-CM

## 2016-05-22 NOTE — Progress Notes (Signed)
US 37+4 wks,cephalic,ant pl gr 3,normal ov's bilat,fhr 136 bpm,afi 11 cm,efw 3022 g 39 %

## 2016-05-23 ENCOUNTER — Encounter (HOSPITAL_COMMUNITY): Payer: Self-pay

## 2016-05-23 ENCOUNTER — Inpatient Hospital Stay (HOSPITAL_COMMUNITY)
Admission: AD | Admit: 2016-05-23 | Discharge: 2016-05-23 | Disposition: A | Discharge status: Home / Self Care | Payer: Medicaid Other | Source: Ambulatory Visit | Attending: Obstetrics & Gynecology | Admitting: Obstetrics & Gynecology

## 2016-05-23 DIAGNOSIS — Z3A39 39 weeks gestation of pregnancy: Secondary | ICD-10-CM | POA: Insufficient documentation

## 2016-05-23 DIAGNOSIS — O471 False labor at or after 37 completed weeks of gestation: Secondary | ICD-10-CM | POA: Insufficient documentation

## 2016-05-23 DIAGNOSIS — M545 Low back pain: Secondary | ICD-10-CM | POA: Insufficient documentation

## 2016-05-23 DIAGNOSIS — O479 False labor, unspecified: Secondary | ICD-10-CM

## 2016-05-23 DIAGNOSIS — O26893 Other specified pregnancy related conditions, third trimester: Secondary | ICD-10-CM | POA: Insufficient documentation

## 2016-05-23 LAB — GC/CHLAMYDIA PROBE AMP
CHLAMYDIA, DNA PROBE: NEGATIVE
Neisseria gonorrhoeae by PCR: NEGATIVE

## 2016-05-23 NOTE — MAU Note (Signed)
Patient presents with lower back pain and pressure since 11:00 am passed what she thinks is her mucus plug. Having pressure and vaginal pain which is constant started today, denies dysuria.

## 2016-05-25 LAB — CULTURE, BETA STREP (GROUP B ONLY): STREP GP B CULTURE: NEGATIVE

## 2016-05-25 LAB — OB RESULTS CONSOLE GBS: GBS: NEGATIVE

## 2016-05-28 ENCOUNTER — Ambulatory Visit (INDEPENDENT_AMBULATORY_CARE_PROVIDER_SITE_OTHER): Payer: Self-pay | Admitting: Women's Health

## 2016-05-28 ENCOUNTER — Encounter: Payer: Self-pay | Admitting: Women's Health

## 2016-05-28 VITALS — BP 108/56 | HR 102 | Wt 147.5 lb

## 2016-05-28 DIAGNOSIS — Z331 Pregnant state, incidental: Secondary | ICD-10-CM

## 2016-05-28 DIAGNOSIS — Z3483 Encounter for supervision of other normal pregnancy, third trimester: Secondary | ICD-10-CM

## 2016-05-28 DIAGNOSIS — Z1389 Encounter for screening for other disorder: Secondary | ICD-10-CM

## 2016-05-28 LAB — POCT URINALYSIS DIPSTICK
Glucose, UA: NEGATIVE
Ketones, UA: NEGATIVE
NITRITE UA: NEGATIVE
PROTEIN UA: NEGATIVE
RBC UA: NEGATIVE

## 2016-05-28 NOTE — Patient Instructions (Signed)
Call the office ([phone]) or go to [facility] if:  You begin to have strong, frequent contractions  Your water breaks.  Sometimes it is a big gush of fluid, sometimes it is just a trickle that keeps getting your panties wet or running down your legs  You have vaginal bleeding.  It is normal to have a small amount of spotting if your cervix was checked.   You don't feel your baby moving like normal.  If you don't, get you something to eat and drink and lay down and focus on feeling your baby move.  You should feel at least 10 movements in 2 hours.  If you don't, you should call the office or go to [facility].     Contracciones de Braxton Hicks (Braxton Hicks Contractions) Durante el embarazo, pueden presentarse contracciones uterinas que no siempre indican que est en trabajo de parto. QU SON LAS CONTRACCIONES DE BRAXTON HICKS? Las contracciones que se presentan antes del trabajo de parto se conocen como contracciones de Braxton Hicks o falso trabajo de parto. Hacia el final del embarazo (32 a 34semanas), estas contracciones pueden aparecen con ms frecuencia y volverse ms intensas. No corresponden al trabajo de parto verdadero porque estas contracciones no producen el agrandamiento (la dilatacin) y el afinamiento del cuello del tero. Algunas veces, es difcil distinguirlas del trabajo de parto verdadero porque en algunos casos pueden ser muy intensas, y las personas tienen diferentes niveles de tolerancia al dolor. No debe sentirse avergonzada si concurre al hospital con falso trabajo de parto. En ocasiones, la nica forma de saber si el trabajo de parto es verdadero es que el mdico determine si hay cambios en el cuello del tero. Si no hay problemas prenatales u otras complicaciones de salud asociadas con el embarazo, no habr inconvenientes si la envan a su casa con falso trabajo de parto y espera que comience el verdadero. CMO DIFERENCIAR EL TRABAJO DE PARTO FALSO DEL  VERDADERO Falso trabajo de parto  Las contracciones del falso trabajo de parto duran menos y no son tan intensas como las verdaderas.  Generalmente son irregulares.  A menudo, se sienten en la parte delantera de la parte baja del abdomen y en la ingle,  y pueden desaparecer cuando camina o cambia de posicin mientras est acostada.  Las contracciones se vuelven ms dbiles y su duracin es menor a medida que el tiempo transcurre.  Por lo general, no se hacen progresivamente ms intensas, regulares y cercanas entre s como en el caso del trabajo de parto verdadero. Verdadero trabajo de parto  Las contracciones del verdadero trabajo de parto duran de 30 a 70segundos, son muy regulares y suelen volverse ms intensas, y aumenta su frecuencia.  No desaparecen cuando camina.  La molestia generalmente se siente en la parte superior del tero y se extiende hacia la zona inferior del abdomen y hacia la cintura.  El mdico podr examinarla para determinar si el trabajo de parto es verdadero. El examen mostrar si el cuello del tero se est dilatando y afinando. LO QUE DEBE RECORDAR  Contine haciendo los ejercicios habituales y siga otras indicaciones que el mdico le d.  Tome todos los medicamentos como le indic el mdico.  Concurra a las visitas prenatales regulares.  Coma y beba con moderacin si cree que est en trabajo de parto.  Si las contracciones de Braxton Hicks le provocan incomodidad:  Cambie de posicin: si est acostada o descansando, camine; si est caminando, descanse.  Sintese y descanse   en una baera con agua tibia.  Beba 2 o 3vasos de agua. La deshidratacin puede provocar contracciones.  Respire lenta y profundamente varias veces por hora. CUNDO DEBO BUSCAR ASISTENCIA MDICA INMEDIATA? Solicite atencin mdica de inmediato si:  Las contracciones se intensifican, se hacen ms regulares y cercanas entre s.  Tiene una prdida de lquido por la  vagina.  Tiene fiebre.  Elimina mucosidad manchada con sangre.  Tiene una hemorragia vaginal abundante.  Tiene dolor abdominal permanente.  Tiene un dolor en la zona lumbar que nunca tuvo antes.  Siente que la cabeza del beb empuja hacia abajo y ejerce presin en la zona plvica.  El beb no se mueve tanto como sola. Esta informacin no tiene como fin reemplazar el consejo del mdico. Asegrese de hacerle al mdico cualquier pregunta que tenga. Document Released: 10/08/2004 Document Revised: 04/22/2015 Document Reviewed: 10/10/2012 Elsevier Interactive Patient Education  2017 Elsevier Inc.  

## 2016-05-28 NOTE — Progress Notes (Signed)
Low-risk OB appointment G2P1001 5571w3d Estimated Date of Delivery: 06/08/16 BP (!) 108/56   Pulse (!) 102   Wt 147 lb 8 oz (66.9 kg)   LMP 09/02/2015 (Exact Date)   BMI 26.13 kg/m   BP, weight, and urine reviewed.  Refer to obstetrical flow sheet for FH & FHR.  Reports good fm.  Denies regular uc's, lof, vb, or uti s/s. No complaints. Losing mucous plug.  SVE per request: 3/70/-2, vtx Reviewed labor s/s, fkc, gbs neg. Plan:  Continue routine obstetrical care  F/U in 1wk for OB appointment

## 2016-06-01 ENCOUNTER — Encounter (HOSPITAL_COMMUNITY): Payer: Self-pay | Admitting: *Deleted

## 2016-06-01 ENCOUNTER — Inpatient Hospital Stay (HOSPITAL_COMMUNITY)
Admission: AD | Admit: 2016-06-01 | Discharge: 2016-06-03 | DRG: 775 | Disposition: A | Discharge status: Home / Self Care | Payer: Medicaid Other | Source: Ambulatory Visit | Attending: Obstetrics & Gynecology | Admitting: Obstetrics & Gynecology

## 2016-06-01 DIAGNOSIS — Z87891 Personal history of nicotine dependence: Secondary | ICD-10-CM

## 2016-06-01 DIAGNOSIS — Z3A39 39 weeks gestation of pregnancy: Secondary | ICD-10-CM

## 2016-06-01 LAB — URINALYSIS, ROUTINE W REFLEX MICROSCOPIC
BILIRUBIN URINE: NEGATIVE
Glucose, UA: NEGATIVE mg/dL
Hgb urine dipstick: NEGATIVE
Ketones, ur: NEGATIVE mg/dL
Leukocytes, UA: NEGATIVE
Nitrite: NEGATIVE
Protein, ur: NEGATIVE mg/dL
SPECIFIC GRAVITY, URINE: 1.002 — AB (ref 1.005–1.030)
pH: 7 (ref 5.0–8.0)

## 2016-06-01 LAB — CBC
HEMATOCRIT: 37.6 % (ref 36.0–46.0)
HEMOGLOBIN: 12.5 g/dL (ref 12.0–15.0)
MCH: 30.1 pg (ref 26.0–34.0)
MCHC: 33.2 g/dL (ref 30.0–36.0)
MCV: 90.6 fL (ref 78.0–100.0)
Platelets: 216 10*3/uL (ref 150–400)
RBC: 4.15 MIL/uL (ref 3.87–5.11)
RDW: 12.8 % (ref 11.5–15.5)
WBC: 9.7 10*3/uL (ref 4.0–10.5)

## 2016-06-01 LAB — TYPE AND SCREEN
ABO/RH(D): O POS
Antibody Screen: NEGATIVE

## 2016-06-01 LAB — RAPID HIV SCREEN (HIV 1/2 AB+AG)
HIV 1/2 Antibodies: NONREACTIVE
HIV-1 P24 ANTIGEN - HIV24: NONREACTIVE

## 2016-06-01 LAB — RPR: RPR: NONREACTIVE

## 2016-06-01 MED ORDER — OXYTOCIN 40 UNITS IN LACTATED RINGERS INFUSION - SIMPLE MED
1.0000 m[IU]/min | INTRAVENOUS | Status: DC
  Administered 2016-06-01: 2 m[IU]/min via INTRAVENOUS

## 2016-06-01 MED ORDER — DIBUCAINE 1 % RE OINT: 1.0000 "application " | TOPICAL_OINTMENT | RECTAL | Status: DC | PRN

## 2016-06-01 MED ORDER — OXYCODONE-ACETAMINOPHEN 5-325 MG PO TABS: 2.0000 | ORAL_TABLET | ORAL | Status: DC | PRN

## 2016-06-01 MED ORDER — FENTANYL CITRATE (PF) 100 MCG/2ML IJ SOLN: 100.0000 ug | INTRAMUSCULAR | Status: DC | PRN

## 2016-06-01 MED ORDER — SOD CITRATE-CITRIC ACID 500-334 MG/5ML PO SOLN: 30.0000 mL | ORAL | Status: DC | PRN

## 2016-06-01 MED ORDER — TETANUS-DIPHTH-ACELL PERTUSSIS 5-2.5-18.5 LF-MCG/0.5 IM SUSP
0.5000 mL | Freq: Once | INTRAMUSCULAR | Status: AC
  Administered 2016-06-03: 0.5 mL via INTRAMUSCULAR

## 2016-06-01 MED ORDER — FLEET ENEMA 7-19 GM/118ML RE ENEM: 1.0000 | ENEMA | Freq: Every day | RECTAL | Status: DC | PRN

## 2016-06-01 MED ORDER — TERBUTALINE SULFATE 1 MG/ML IJ SOLN
0.2500 mg | Freq: Once | INTRAMUSCULAR | Status: DC | PRN
  Filled 2016-06-01: qty 1

## 2016-06-01 MED ORDER — SENNOSIDES-DOCUSATE SODIUM 8.6-50 MG PO TABS
2.0000 | ORAL_TABLET | ORAL | Status: DC
  Administered 2016-06-02 (×2): 2 via ORAL
  Filled 2016-06-01 (×2): qty 2

## 2016-06-01 MED ORDER — OXYCODONE-ACETAMINOPHEN 5-325 MG PO TABS
1.0000 | ORAL_TABLET | ORAL | Status: DC | PRN
  Administered 2016-06-02: 1 via ORAL
  Filled 2016-06-01: qty 1

## 2016-06-01 MED ORDER — LACTATED RINGERS IV SOLN: 500.0000 mL | INTRAVENOUS | Status: DC | PRN

## 2016-06-01 MED ORDER — PRENATAL MULTIVITAMIN CH
1.0000 | ORAL_TABLET | Freq: Every day | ORAL | Status: DC
  Administered 2016-06-02 – 2016-06-03 (×2): 1 via ORAL
  Filled 2016-06-01 (×2): qty 1

## 2016-06-01 MED ORDER — IBUPROFEN 600 MG PO TABS
600.0000 mg | ORAL_TABLET | Freq: Four times a day (QID) | ORAL | Status: DC
  Administered 2016-06-01 – 2016-06-03 (×8): 600 mg via ORAL
  Filled 2016-06-01 (×8): qty 1

## 2016-06-01 MED ORDER — ZOLPIDEM TARTRATE 5 MG PO TABS: 5.0000 mg | ORAL_TABLET | Freq: Every evening | ORAL | Status: DC | PRN

## 2016-06-01 MED ORDER — BENZOCAINE-MENTHOL 20-0.5 % EX AERO
1.0000 "application " | INHALATION_SPRAY | CUTANEOUS | Status: DC | PRN
  Administered 2016-06-01: 1 via TOPICAL
  Filled 2016-06-01: qty 56

## 2016-06-01 MED ORDER — COCONUT OIL OIL
1.0000 "application " | TOPICAL_OIL | Status: DC | PRN
  Administered 2016-06-02: 1 via TOPICAL
  Filled 2016-06-01: qty 120

## 2016-06-01 MED ORDER — FENTANYL CITRATE (PF) 100 MCG/2ML IJ SOLN
100.0000 ug | INTRAMUSCULAR | Status: DC | PRN
  Administered 2016-06-01: 100 ug via INTRAVENOUS
  Filled 2016-06-01: qty 2

## 2016-06-01 MED ORDER — ONDANSETRON HCL 4 MG/2ML IJ SOLN: 4.0000 mg | Freq: Four times a day (QID) | INTRAMUSCULAR | Status: DC | PRN

## 2016-06-01 MED ORDER — WITCH HAZEL-GLYCERIN EX PADS: 1.0000 "application " | MEDICATED_PAD | CUTANEOUS | Status: DC | PRN

## 2016-06-01 MED ORDER — ACETAMINOPHEN 325 MG PO TABS
650.0000 mg | ORAL_TABLET | ORAL | Status: DC | PRN
Start: 2016-06-01 — End: 2016-06-01

## 2016-06-01 MED ORDER — LIDOCAINE HCL (PF) 1 % IJ SOLN
30.0000 mL | INTRAMUSCULAR | Status: DC | PRN
  Filled 2016-06-01: qty 30

## 2016-06-01 MED ORDER — OXYTOCIN 40 UNITS IN LACTATED RINGERS INFUSION - SIMPLE MED
2.5000 [IU]/h | INTRAVENOUS | Status: DC
  Filled 2016-06-01: qty 1000

## 2016-06-01 MED ORDER — PRENATAL PLUS 27-1 MG PO TABS: 1.0000 | ORAL_TABLET | Freq: Every day | ORAL | Status: DC

## 2016-06-01 MED ORDER — LACTATED RINGERS IV SOLN
INTRAVENOUS | Status: DC
  Administered 2016-06-01: 09:00:00 via INTRAVENOUS

## 2016-06-01 MED ORDER — DIPHENHYDRAMINE HCL 25 MG PO CAPS: 25.0000 mg | ORAL_CAPSULE | Freq: Four times a day (QID) | ORAL | Status: DC | PRN

## 2016-06-01 MED ORDER — ONDANSETRON HCL 4 MG PO TABS: 4.0000 mg | ORAL_TABLET | ORAL | Status: DC | PRN

## 2016-06-01 MED ORDER — SIMETHICONE 80 MG PO CHEW: 80.0000 mg | CHEWABLE_TABLET | ORAL | Status: DC | PRN

## 2016-06-01 MED ORDER — PRENATAL MULTIVITAMIN CH
1.0000 | ORAL_TABLET | Freq: Every day | ORAL | Status: DC
Start: 2016-06-02 — End: 2016-06-01

## 2016-06-01 MED ORDER — ONDANSETRON HCL 4 MG/2ML IJ SOLN: 4.0000 mg | INTRAMUSCULAR | Status: DC | PRN

## 2016-06-01 MED ORDER — ACETAMINOPHEN 325 MG PO TABS
650.0000 mg | ORAL_TABLET | ORAL | Status: DC | PRN
  Administered 2016-06-01: 650 mg via ORAL
  Filled 2016-06-01: qty 2

## 2016-06-01 MED ORDER — OXYTOCIN BOLUS FROM INFUSION
500.0000 mL | Freq: Once | INTRAVENOUS | Status: AC
  Administered 2016-06-01: 500 mL via INTRAVENOUS

## 2016-06-01 NOTE — Progress Notes (Signed)
Assisted RN with admission information, ordered her meals, by Orlan LeavensViria Alvarez Spanish Interpreter

## 2016-06-01 NOTE — Anesthesia Pain Management Evaluation Note (Signed)
  CRNA Pain Management Visit Note  Patient: Jade Castillo, 21 y.o., female  "Hello I am a member of the anesthesia team at [facility]. We have an anesthesia team available at all times to provide care throughout the hospital, including epidural management and anesthesia for C-section. I don't know your plan for the delivery whether it a natural birth, water birth, IV sedation, nitrous supplementation, doula or epidural, but we want to meet your pain goals."   1.Was your pain managed to your expectations on prior hospitalizations?   Yes   2.What is your expectation for pain management during this hospitalization?     Labor support without medications  3.How can we help you reach that goal? unsure  Record the patient's initial score and the patient's pain goal.   Pain: 9  Pain Goal: 10 [facility] wants you to be able to say your pain was always managed very well.  Jade Castillo,Jade Castillo 06/01/2016

## 2016-06-01 NOTE — Progress Notes (Signed)
S: Patient seen & examined for progress of labor. Patient comfortable. No epidural.   O:  Vitals:   06/01/16 1200 06/01/16 1229 06/01/16 1300 06/01/16 1332  BP:    (!) 105/55  Pulse:    74  Resp: 18 18 18 18   Temp:    98 F (36.7 C)  TempSrc:    Oral  Weight:      Height:        Dilation: 8 Effacement (%): 90 Station: -1 Presentation: Vertex Exam by:: Earlene Plateravis, RN    FHT: 120 bpm, mod var, +accels, early decels TOCO: variable   A/P: Category 1 tracing - Start pitocin 2 mu.min inc by 2 mu/m per hour - Continue expectant management Anticipate SVD

## 2016-06-01 NOTE — Progress Notes (Addendum)
  AROM at 10:15 AM.  Dilation: 7 Effacement (%): 90 Station: -2 Presentation: Vertex

## 2016-06-01 NOTE — H&P (Signed)
LABOR ADMISSION HISTORY AND PHYSICAL  Jade Castillo is a 21 y.o. female G2P1001 with IUP at 1822w0d by 11 week ultrasound and LMP who presented to MAU with contractions, found to be 6cm dilated on cervical exam. She reports +FM, + contractions, No LOF, no VB, no blurry vision, headaches or peripheral edema, and RUQ pain.  She plans on breast feeding. She has not decided and is still considering birth control options  Dating: By LMP and 11w sonogram --->  Estimated Date of Delivery: 06/08/16  Prenatal History/Complications:  Past Medical History: Past Medical History:  Diagnosis Date  . Colitis   . Hypotension   . No pertinent past medical history     Past Surgical History: Past Surgical History:  Procedure Laterality Date  . NO PAST SURGERIES      Obstetrical History: OB History    Gravida Para Term Preterm AB Living   2 1 1     1    SAB TAB Ectopic Multiple Live Births           1      Social History: Social History   Social History  . Marital status: Married    Spouse name: N/A  . Number of children: N/A  . Years of education: N/A   Social History Main Topics  . Smoking status: Former Smoker    Quit date: 12/02/2009  . Smokeless tobacco: Former NeurosurgeonUser  . Alcohol use No  . Drug use: No  . Sexual activity: Not Currently    Birth control/ protection: None   Other Topics Concern  . None   Social History Narrative  . None    Family History: Family History  Problem Relation Age of Onset  . Kidney disease Mother   . Glaucoma Father   . Hypertension Maternal Grandfather   . Hypertension Paternal Grandmother   . Alcohol abuse Neg Hx   . Arthritis Neg Hx   . Asthma Neg Hx   . Birth defects Neg Hx   . Cancer Neg Hx   . COPD Neg Hx   . Depression Neg Hx   . Diabetes Neg Hx   . Drug abuse Neg Hx   . Early death Neg Hx   . Hearing loss Neg Hx   . Hyperlipidemia Neg Hx   . Heart disease Neg Hx   . Learning disabilities Neg Hx   . Mental illness Neg  Hx   . Mental retardation Neg Hx   . Miscarriages / Stillbirths Neg Hx   . Stroke Neg Hx   . Vision loss Neg Hx     Allergies: No Known Allergies  Prescriptions Prior to Admission  Medication Sig Dispense Refill Last Dose  . prenatal vitamin w/FE, FA (PRENATAL 1 + 1) 27-1 MG TABS tablet Take 1 tablet by mouth daily at 12 noon. 30 each 12 05/31/2016 at Unknown time     Review of Systems   All systems reviewed and negative except as stated in HPI  BP 127/74   Pulse 83   Temp 98 F (36.7 C) (Oral)   Resp 18   Ht 5\' 3"  (1.6 m)   Wt 147 lb (66.7 kg)   LMP 09/02/2015 (Exact Date)   BMI 26.04 kg/m  General appearance: alert, cooperative and no distress Lungs: clear to auscultation bilaterally Heart: regular rate and rhythm Abdomen: gravid, soft, non-tender Extremities: Homans sign is negative, no sign of DVT, no edema Fetal monitoring  Basline 130, moderate variablility +accels no  decels Uterine activity: Irregular, 4-7 mins apart  Dilation: 6 Effacement (%): 80 Station: -2 Exam by:: Morrison Old RN   Prenatal labs: ABO, Rh:  O pos Antibody: Negative (03/20 0918) Rubella: Immune RPR: Non Reactive (03/20 0918)  HBsAg: Negative (11/21 1209)  HIV: Non Reactive (03/20 0918)  GBS: Negative (05/14 0000)  1 hr Glucola Normal Genetic screening  negative Anatomy US: normal female  Prenatal Transfer Tool  Maternal Diabetes: No Genetic Screening: Normal Maternal Ultrasounds/Referrals: Normal Fetal Ultrasounds or other Referrals:  None Maternal Substance Abuse:  No Significant Maternal Medications:  None Significant Maternal Lab Results: Lab values include: Group B Strep negative  Results for orders placed or performed during the hospital encounter of 06/01/16 (from the past 24 hour(s))  Urinalysis, Routine w reflex microscopic   Collection Time: 06/01/16  7:55 AM  Result Value Ref Range   Color, Urine STRAW (A) YELLOW   APPearance CLEAR CLEAR   Specific Gravity,  Urine 1.002 (L) 1.005 - 1.030   pH 7.0 5.0 - 8.0   Glucose, UA NEGATIVE NEGATIVE mg/dL   Hgb urine dipstick NEGATIVE NEGATIVE   Bilirubin Urine NEGATIVE NEGATIVE   Ketones, ur NEGATIVE NEGATIVE mg/dL   Protein, ur NEGATIVE NEGATIVE mg/dL   Nitrite NEGATIVE NEGATIVE   Leukocytes, UA NEGATIVE NEGATIVE    Patient Active Problem List   Diagnosis Date Noted  . Labor and delivery, indication for care 06/01/2016  . Supervision of normal pregnancy 12/03/2015    Assessment: Jade Castillo is a 21 y.o. G2P1001 at [redacted]w[redacted]d who presented to MAU with contractions found to be 6 cm dilated and admitted for expectant management.  #Labor: progressing normally #Pain: Controlled with IV meds - does not want an epdirual #FWB: Category I #ID:  GBS negative #MOF: breast feeding #MOC: undecided, considering LARCs  Howard Pouch, MD PGY-1 [facility]  OB FELLOW HISTORY AND PHYSICAL ATTESTATION  I confirm that I have verified the information documented in the resident's note and that I have also personally reperformed the physical exam and all medical decision making activities.   Patient's contractions have spaced out. AROM for clear fluid. Expect SVD.   Ernestina Penna 06/01/2016, 10:43 AM

## 2016-06-01 NOTE — MAU Note (Signed)
C/o ucs since 0300 this Am; denies SROM:

## 2016-06-02 ENCOUNTER — Encounter: Payer: Self-pay | Admitting: Obstetrics & Gynecology

## 2016-06-02 NOTE — Discharge Instructions (Signed)
Parto vaginal, cuidados de puerperio  (Postpartum Care After Vaginal Delivery)  El período de tiempo que sigue inmediatamente al parto se conoce como puerperio.  ¿QUÉ TIPO DE ATENCIÓN MÉDICA RECIBIRÉ?  · Podría continuar recibiendo medicamentos y líquidos través de una vía intravenosa (IV) que se colocará en una de sus venas.  · Si se le realizó una incisión cerca de la vagina (episiotomía) o si ha tenido algún desgarro durante el parto, podrían indicarle que se coloque compresas frías sobre la episiotomía o el desgarro. Esto ayuda a aliviar el dolor y la hinchazón.  · Es posible que le den una botella rociadora para que use cuando vaya al baño. Puede utilizarla hasta que se sienta cómoda limpiándose de la manera habitual. Siga los pasos a continuación para usar la botella rociadora:  ? Antes de orinar, llene la botella rociadora con agua tibia. No use agua caliente.  ? Después de orinar, mientras aún está sentada en el inodoro, use la botella rociadora para enjuagar el área alrededor de la uretra y la abertura vaginal. Con esto podrá limpiar cualquier rastro de orina y sangre.  ? Puede hacer esto en lugar de secarse. Cuando comience a sanar, podrá usar la botella rociadora antes de secarse. Asegúrese de secarse suavemente.  ? Llene la botella rociadora con agua limpia cada vez que vaya al baño.  · Deberá usar apósitos sanitarios.    ¿CÓMO PUEDO SENTIRME?  · Quizás no tenga necesidad de orinar durante varias horas después del parto.  · Sentirá algo de dolor y molestias en el abdomen y la vagina.  · Si está amamantando, podría tener contracciones uterinas cada vez que lo haga. Estas podrían prolongarse hasta varias semanas durante el puerperio. Las contracciones uterinas ayudan al útero a regresar a su tamaño habitual.  · Es normal tener un poco de hemorragia vaginal (loquios) después del parto. La cantidad y apariencia de los loquios a menudo es similar a las del período menstrual la primera semana después del  parto. Disminuirá gradualmente las siguientes semanas hasta convertirse en una descarga seca amarronada o amarillenta. En la mayoría de las mujeres, los loquios se detienen completamente entre 6 a 8 semanas después del parto. Los sangrados vaginales pueden variar de mujer a mujer.  · Los primeros días después del parto, podría padecer congestión mamaria. Los pechos se sentirán pesados, llenos y molestos. Las mamas también podrían latir y ponerse duras, muy tirantes, calientes y sensibles al tacto. Cuando esto ocurra, podría notar leche que se escapa de los senos. El médico puede recomendarle algunos métodos para aliviar este malestar causado por la congestión mamaria. La congestión mamaria debería desaparecer al cabo de unos días.  · Podría sentirse más deprimida o preocupada que lo habitual debido a los cambios hormonales luego del parto. Estos sentimientos no deben durar más de unos pocos días. Si no desaparecen al cabo de algunos días, hable con su médico.    ¿QUÉ CUIDADOS DEBO TENER?  · Infórmele a su médico si siente dolor o malestar.  · Beba suficiente agua para mantener la orina clara o de color amarillo pálido.  · Lávese bien las manos con agua y jabón durante al menos 20 segundos después de cambiar el apósito sanitario, usar el baño o antes de sostener o alimentar al bebé.  · Si no está amamantando, evite tocarse mucho los senos. Al hacerlo, podrían producir más leche.  · Si se siente débil o mareada, o si siente que está a punto de desmayarse, pida ayuda antes de realizar lo   siguiente:  ? Levantarse de la cama.  ? Ducharse.  · Cambie los apósitos sanitarios con frecuencia. Observe si hay cambios en el flujo, como un aumento repentino en el volumen, cambios en el color o coágulos sanguíneos de gran tamaño. Si expulsa un coágulo sanguíneo por la vagina, guárdelo para mostrárselo a su médico. No tire la cadena sin que el médico examine el coágulo antes.  · Asegúrese de tener todas las vacunas al día. Esto la  ayudará a estar protegida y a proteger al bebé de determinadas enfermedades. Podría necesitar vacunas antes de dejar el hospital.  · Si lo desea, hable con el médico acerca de los métodos de planificación familiar o control de la natalidad (métodos anticonceptivos).    ¿CÓMO PUEDO ESTABLECER LAZOS CON MI BEBÉ?  Pasar tanto tiempo como le sea posible con el bebé es sumamente importante. Durante ese tiempo, usted y su bebé pueden conocerse y desarrollar lazos. Tener al bebé con usted en la habitación le dará tiempo de conocerlo. Esto también puede hacerla sentir más cómoda para atender al bebé. Amamantar también puede ayudarla a crear lazos con el bebé.  ¿CÓMO PUEDO PLANIFICAR MI REGRESO A CASA CON EL BEBÉ?  · Asegúrese de tener instalada una butaca en el automóvil.  ? La butaca debe contar con la certificación del fabricante para asegurarse de que esté instalada en forma segura.  ? Asegúrese de que el bebé quede bien asegurado en la butaca.  · Pregúntele al médico todo lo que necesite saber sobre los cuidados de su bebé. Asegúrese de poder comunicarse con el médico en caso de que tenga preguntas luego de dejar el hospital.    Esta información no tiene como fin reemplazar el consejo del médico. Asegúrese de hacerle al médico cualquier pregunta que tenga.  Document Released: 10/26/2006 Document Revised: 04/22/2015 Document Reviewed: 12/03/2014  Elsevier Interactive Patient Education © 2017 [employer] Inc[employer]

## 2016-06-02 NOTE — Lactation Note (Signed)
This note was copied from a baby's chart. Lactation Consultation Note  Patient Name: Jade Castillo Today's Date: 06/02/2016 Reason for consult: Initial assessment Breastfeeding consultation services and support information given to patient.  This is mom's second baby and newborn is 9325 hours old.  Mom breastfed her first baby for 18 months.  She states baby is nursing well and denies difficulty with latch.  Mom reports initial latch on pain which improves after the first minute.  Instructed to feed with any feeding cue and call for assist/concerns prn.  Maternal Data Formula Feeding for Exclusion: Yes Does the patient have breastfeeding experience prior to this delivery?: Yes  Feeding Feeding Type: Breast Fed  LATCH Score/Interventions Latch: Grasps breast easily, tongue down, lips flanged, rhythmical sucking. Intervention(s): Adjust position;Assist with latch  Audible Swallowing: A few with stimulation Intervention(s): Hand expression;Skin to skin  Type of Nipple: Everted at rest and after stimulation  Comfort (Breast/Nipple): Filling, red/small blisters or bruises, mild/mod discomfort  Problem noted: Mild/Moderate discomfort;Cracked, bleeding, blisters, bruises  Hold (Positioning): Assistance needed to correctly position infant at breast and maintain latch.  LATCH Score: 7  Lactation Tools Discussed/Used     Consult Status Consult Status: Follow-up Date: 06/03/16 Follow-up type: In-patient    Huston FoleyMOULDEN, Helix Lafontaine S 06/02/2016, 3:41 PM

## 2016-06-02 NOTE — Discharge Summary (Signed)
OB Discharge Summary     Patient Name: Jade Castillo DOB: Apr 18, 1995 MRN: [id number]  Date of admission: 06/01/2016 Delivering MD: Lorne SkeensSCHENK, NICHOLAS MICHAEL   Date of discharge: 06/02/2016  Admitting diagnosis: 39WKS,CTX Intrauterine pregnancy: 5844w0d     Secondary diagnosis:  Active Problems:   Labor and delivery, indication for care  Additional problems: non     Discharge diagnosis: Term Pregnancy Delivered                                                                                                Post partum procedures:none  Augmentation: none  Complications: None  Hospital course:  Onset of Labor With Vaginal Delivery     21 y.o. yo G2P2002 at 5344w0d was admitted in Active Labor on 06/01/2016. Patient had an uncomplicated labor course as follows:  Membrane Rupture Time/Date: 10:25 AM ,06/01/2016   Intrapartum Procedures: Episiotomy: None [1]                                         Lacerations:  Periurethral [8]  Patient had a delivery of a Viable infant. 06/01/2016  Information for the patient's newborn:  Jade Castillo, Girl Jade Castillo [[id number]][[id number]]  Delivery Method: Vaginal, Spontaneous Delivery (Filed from Delivery Summary)    Pateint had an uncomplicated postpartum course.  She is ambulating, tolerating a regular diet, passing flatus, and urinating well. Patient is discharged home in stable condition on 06/02/16.   Physical exam  Vitals:   06/01/16 1615 06/01/16 1749 06/01/16 2217 06/02/16 0520  BP: (!) 87/63 96/64 (!) 110/54 99/74  Pulse: 91 70 81 81  Resp: 18 18 18 18   Temp: 98.1 F (36.7 C) 98.2 F (36.8 C) 97.8 F (36.6 C) 97.9 F (36.6 C)  TempSrc: Oral Axillary Axillary Oral  Weight:      Height:       General: alert, cooperative and no distress Lochia: appropriate Uterine Fundus: firm Incision: N/A DVT Evaluation: No cords or calf tenderness. No significant calf/ankle edema. Labs: Lab Results  Component Value Date   WBC 9.7 06/01/2016    HGB 12.5 06/01/2016   HCT 37.6 06/01/2016   MCV 90.6 06/01/2016   PLT 216 06/01/2016   No flowsheet data found.  Discharge instruction: per After Visit Summary and "Baby and Me Booklet".  After visit meds:  Allergies as of 06/02/2016   No Known Allergies     Medication List    TAKE these medications   prenatal vitamin w/FE, FA 27-1 MG Tabs tablet Take 1 tablet by mouth daily at 12 noon.       Diet: routine diet  Activity: Advance as tolerated. Pelvic rest for 6 weeks.   Outpatient follow up:6 weeks Follow up Appt:Future Appointments Date Time Provider Department Center  07/16/2016 10:30 AM Cheral MarkerBooker, Kimberly R, CNM [facility]   Follow up Visit:No Follow-up on file.  Postpartum contraception: Undecided  Newborn Data: Live born female  Birth Weight: 7 lb 1 oz (3204 g) APGAR: 9,  9  Baby Feeding: Breast Disposition:pending   06/02/2016 Silvano Bilis, MD

## 2016-06-03 NOTE — Lactation Note (Signed)
This note was copied from a baby's chart. Lactation Consultation Note  Patient Name: Jade Castillo Today's Date: 06/03/2016 Reason for consult: Follow-up assessment   With this mom of a term baby, now 4443 hours old. Eda, Spanish interpreter p[resent during consult. Mom's milk is transitioning in, and breastfeeding is going well. Mom breast fed her 21 year old for 9 months. I observed mom trying to latch her baby, but baby was sleepy. Although mom was not able to latct at this time, mom was very competent in how to do so. Mom has easily expressed transitional milk. Mom has my phone number to call for help as needed. Baby and mom to be discharged to home today.    Maternal Data    Feeding Feeding Type: Breast Fed  LATCH Score/Interventions Latch: Too sleepy or reluctant, no latch achieved, no sucking elicited. (mom had breast fed baby for 40 minutes about 1 1/2 hours prior to consult. baby not hungry yet)     Type of Nipple: Everted at rest and after stimulation  Comfort (Breast/Nipple): Soft / non-tender     Hold (Positioning): No assistance needed to correctly position infant at breast.     Lactation Tools Discussed/Used     Consult Status Consult Status: Complete Follow-up type: Call as needed    Jade Castillo, Jade Castillo Anne 06/03/2016, 10:05 AM

## 2016-06-03 NOTE — Discharge Summary (Signed)
OB Discharge Summary                           Patient Name: Jade Castillo DOB: 04/21/1995 MRN: [id number]  Date of admission: 06/01/2016 Delivering MD: Lorne Skeens   Date of discharge: 06/03/2016  Admitting diagnosis: 39WKS,CTX Intrauterine pregnancy: [redacted]w[redacted]d     Secondary diagnosis:  Active Problems:   Labor and delivery, indication for care  Additional problems: none                                      Discharge diagnosis: Term Pregnancy Delivered                                                                                                Post partum procedures:none  Augmentation: none  Complications: None  Hospital course:  Onset of Labor With Vaginal Delivery     21 y.o. yo G2P2002 at [redacted]w[redacted]d was admitted in Active Labor on 06/01/2016. Patient had an uncomplicated labor course as follows:  Membrane Rupture Time/Date: 10:25 AM ,06/01/2016   Intrapartum Procedures: Episiotomy: None [1]                                         Lacerations:  Periurethral [8]  Patient had a delivery of a Viable infant. 06/01/2016  Information for the patient's newborn:  Wakisha Alberts, Girl Shizuye [[id number]]  Delivery Method: Vaginal, Spontaneous Delivery (Filed from Delivery Summary)    Pateint had an uncomplicated postpartum course.  She is ambulating, tolerating a regular diet, passing flatus, and urinating well. Patient is discharged home in stable condition on 06/02/16.   Physical exam        Vitals:   06/01/16 1615 06/01/16 1749 06/01/16 2217 06/02/16 0520  BP: (!) 87/63 96/64 (!) 110/54 99/74  Pulse: 91 70 81 81  Resp: 18 18 18 18   Temp: 98.1 F (36.7 C) 98.2 F (36.8 C) 97.8 F (36.6 C) 97.9 F (36.6 C)  TempSrc: Oral Axillary Axillary Oral  Weight:      Height:       General: alert, cooperative and no distress Lochia: appropriate Uterine Fundus: firm Incision: N/A DVT Evaluation: No cords or calf  tenderness. No significant calf/ankle edema. Labs: RecentLabs       Lab Results  Component Value Date   WBC 9.7 06/01/2016   HGB 12.5 06/01/2016   HCT 37.6 06/01/2016   MCV 90.6 06/01/2016   PLT 216 06/01/2016     No flowsheet data found.  Discharge instruction: per After Visit Summary and "Baby and Me Booklet".  After visit meds:  Allergies as of 06/02/2016   No Known Allergies        Medication List    TAKE these medications   prenatal vitamin w/FE, FA 27-1 MG Tabs tablet Take 1 tablet by mouth daily at 12 noon.  Diet: routine diet  Activity: Advance as tolerated. Pelvic rest for 6 weeks.   Outpatient follow up:6 weeks Follow up Appt:Future Appointments Date Time Provider Department Center  07/16/2016 10:30 AM Cheral MarkerBooker, Kimberly R, CNM [facility]   Follow up Visit:No Follow-up on file.  Postpartum contraception: Undecided  Newborn Data: Live born female  Birth Weight: 7 lb 1 oz (3204 g) APGAR: 9, 9  Baby Feeding: Breast

## 2016-06-05 ENCOUNTER — Encounter: Payer: Self-pay | Admitting: Obstetrics & Gynecology

## 2016-07-16 ENCOUNTER — Ambulatory Visit: Payer: Self-pay | Admitting: Women's Health

## 2016-07-24 ENCOUNTER — Ambulatory Visit (INDEPENDENT_AMBULATORY_CARE_PROVIDER_SITE_OTHER): Payer: Self-pay | Admitting: Women's Health

## 2016-07-24 ENCOUNTER — Encounter: Payer: Self-pay | Admitting: Women's Health

## 2016-07-24 NOTE — Progress Notes (Signed)
Subjective:    Jade Castillo is a 21 y.o. 432P2002 Hispanic female who presents for a postpartum visit. She is 7 weeks postpartum following a spontaneous vaginal delivery at 39.0 gestational weeks. Anesthesia: none. I have fully reviewed the prenatal and intrapartum course. Postpartum course has been uncomplicated. Baby's course has been uncomplicated. Baby is feeding by breast. Bleeding no bleeding. Bowel function is normal. Bladder function is normal. Patient is sexually active. Last sexual activity: few days ago. Contraception method is plans condoms, did not use the other day. Postpartum depression screening: negative. Score 2.  Last pap <21yo.  The following portions of the patient's history were reviewed and updated as appropriate: allergies, current medications, past medical history, past surgical history and problem list.  Review of Systems Pertinent items are noted in HPI.   Vitals:   07/24/16 1303  BP: (!) 86/58  Pulse: 60  Weight: 118 lb (53.5 kg)  Height: 5' 2.5" (1.588 m)   Patient's last menstrual period was 07/17/2016 (exact date).  Objective:   General:  alert, cooperative and no distress   Breasts:  deferred, no complaints  Lungs: clear to auscultation bilaterally  Heart:  regular rate and rhythm  Abdomen: soft, nontender   Vulva: normal  Vagina: normal vagina  Cervix:  closed  Corpus: Well-involuted  Adnexa:  Non-palpable  Rectal Exam: No hemorrhoids        Assessment:   Postpartum exam 7 wks s/p SVB Breastfeeding Depression screening Contraception counseling   Plan:  Contraception: condoms, make sure to use every time if doesn't desire pregnancy Follow up  @ 21yo for pap & physical or earlier if needed  Marge DuncansBooker, Keyana Guevara Randall CNM, [facility] PaWHNP-BC 07/24/2016 1:19 PM
